# Patient Record
Sex: Female | Born: 1990 | Hispanic: Yes | Marital: Single | Smoking: Never smoker
Health system: Southern US, Community
[De-identification: ages and names within clinical notes are randomized; demographics above are authoritative.]

## PROBLEM LIST (undated history)

## (undated) HISTORY — PX: TONSILLECTOMY: SUR1361

---

## 2020-06-06 ENCOUNTER — Emergency Department (HOSPITAL_COMMUNITY)
Admission: EM | Admit: 2020-06-06 | Discharge: 2020-06-06 | Disposition: A | Discharge status: Home / Self Care | Payer: Medicaid Other | Attending: Emergency Medicine | Admitting: Emergency Medicine

## 2020-06-06 ENCOUNTER — Encounter (HOSPITAL_COMMUNITY): Payer: Self-pay | Admitting: Emergency Medicine

## 2020-06-06 ENCOUNTER — Emergency Department (HOSPITAL_COMMUNITY): Payer: Medicaid Other

## 2020-06-06 DIAGNOSIS — D72829 Elevated white blood cell count, unspecified: Secondary | ICD-10-CM | POA: Diagnosis not present

## 2020-06-06 DIAGNOSIS — K529 Noninfective gastroenteritis and colitis, unspecified: Secondary | ICD-10-CM | POA: Diagnosis not present

## 2020-06-06 DIAGNOSIS — R109 Unspecified abdominal pain: Secondary | ICD-10-CM | POA: Diagnosis present

## 2020-06-06 LAB — CBC
HCT: 42 % (ref 36.0–46.0)
Hemoglobin: 14.1 g/dL (ref 12.0–15.0)
MCH: 29.2 pg (ref 26.0–34.0)
MCHC: 33.6 g/dL (ref 30.0–36.0)
MCV: 87 fL (ref 80.0–100.0)
Platelets: 258 10*3/uL (ref 150–400)
RBC: 4.83 MIL/uL (ref 3.87–5.11)
RDW: 12.1 % (ref 11.5–15.5)
WBC: 12.2 10*3/uL — ABNORMAL HIGH (ref 4.0–10.5)
nRBC: 0 % (ref 0.0–0.2)

## 2020-06-06 LAB — COMPREHENSIVE METABOLIC PANEL
ALT: 27 U/L (ref 0–44)
AST: 24 U/L (ref 15–41)
Albumin: 4.7 g/dL (ref 3.5–5.0)
Alkaline Phosphatase: 85 U/L (ref 38–126)
Anion gap: 6 (ref 5–15)
BUN: 11 mg/dL (ref 6–20)
CO2: 27 mmol/L (ref 22–32)
Calcium: 9.7 mg/dL (ref 8.9–10.3)
Chloride: 102 mmol/L (ref 98–111)
Creatinine, Ser: 0.68 mg/dL (ref 0.44–1.00)
GFR, Estimated: >60 mL/min (ref >60)
Glucose, Bld: 100 mg/dL — ABNORMAL HIGH (ref 70–99)
Potassium: 4.6 mmol/L (ref 3.5–5.1)
Sodium: 135 mmol/L (ref 135–145)
Total Bilirubin: 1.4 mg/dL — ABNORMAL HIGH (ref 0.3–1.2)
Total Protein: 8 g/dL (ref 6.5–8.1)

## 2020-06-06 LAB — I-STAT BETA HCG BLOOD, ED (MC, WL, AP ONLY): I-stat hCG, quantitative: <5 m[IU]/mL (ref <5)

## 2020-06-06 LAB — LIPASE, BLOOD: Lipase: 36 U/L (ref 11–51)

## 2020-06-06 MED ORDER — ONDANSETRON 4 MG PO TBDP: 4.0000 mg | ORAL_TABLET | Freq: Three times a day (TID) | ORAL | 0 refills | Status: DC | PRN

## 2020-06-06 MED ORDER — DICYCLOMINE HCL 20 MG PO TABS: 20.0000 mg | ORAL_TABLET | Freq: Two times a day (BID) | ORAL | 0 refills | Status: AC

## 2020-06-06 MED ORDER — ALUM & MAG HYDROXIDE-SIMETH 200-200-20 MG/5ML PO SUSP
30.0000 mL | Freq: Once | ORAL | Status: AC
  Administered 2020-06-06: 30 mL via ORAL
  Filled 2020-06-06: qty 30

## 2020-06-06 MED ORDER — DICYCLOMINE HCL 20 MG PO TABS: 20.0000 mg | ORAL_TABLET | Freq: Two times a day (BID) | ORAL | 0 refills | Status: AC | PRN

## 2020-06-06 MED ORDER — ONDANSETRON HCL 8 MG PO TABS: 8.0000 mg | ORAL_TABLET | Freq: Three times a day (TID) | ORAL | 0 refills | Status: DC | PRN

## 2020-06-06 MED ORDER — LIDOCAINE VISCOUS HCL 2 % MT SOLN
15.0000 mL | Freq: Once | OROMUCOSAL | Status: AC
  Administered 2020-06-06: 15 mL via ORAL
  Filled 2020-06-06: qty 15

## 2020-06-06 NOTE — ED Provider Notes (Signed)
[facility] EMERGENCY DEPARTMENT Provider Note   CSN: [id number] Arrival date & time: 06/06/20  1125     History Chief Complaint  Patient presents with  . Abdominal Pain    Marcia Owen is a 30 y.o. female.  HPI      Marcia Owen is a 30 y.o. female, patient with no pertinent past medical history, presenting to the ED with abdominal pain beginning around 4 AM this morning.   She did have 2 episodes of nonbloody, nonbilious emesis. Initially, her pain was more severe, has improved since onset.  Pain is cramping, left upper quadrant and epigastric, nonradiating from these locations, intermittent. She is 9 months postpartum and is breast-feeding.  Denies fever/chills, diarrhea, constipation, hematochezia/melena, urinary symptoms, chest pain, shortness of breath, cough, flank/back pain, or any other complaints.   History reviewed. No pertinent past medical history.  There are no problems to display for this patient.   Past Surgical History:  Procedure Laterality Date  . TONSILLECTOMY       OB History   No obstetric history on file.     History reviewed. No pertinent family history.  Social History   Tobacco Use  . Smoking status: Never Smoker  . Smokeless tobacco: Never Used  Substance Use Topics  . Alcohol use: Not Currently  . Drug use: Not Currently    Home Medications Prior to Admission medications   Medication Sig Start Date End Date Taking? Authorizing Provider  dicyclomine (BENTYL) 20 MG tablet Take 1 tablet (20 mg total) by mouth 2 (two) times daily. 06/06/20  Yes Liam Bossman C, PA-C  dicyclomine (BENTYL) 20 MG tablet Take 1 tablet (20 mg total) by mouth every 12 (twelve) hours as needed (Abdominal cramping). 06/06/20  Yes Mancel Bale, MD  ondansetron (ZOFRAN ODT) 4 MG disintegrating tablet Take 1 tablet (4 mg total) by mouth every 8 (eight) hours as needed for nausea or vomiting. 06/06/20  Yes Mikhayla Phillis C, PA-C  ondansetron  (ZOFRAN) 8 MG tablet Take 1 tablet (8 mg total) by mouth every 8 (eight) hours as needed for nausea or vomiting. 06/06/20  Yes Mancel Bale, MD    Allergies    Amoxicillin and Penicillins  Review of Systems   Review of Systems  Constitutional: Negative for chills, diaphoresis and fever.  Respiratory: Negative for cough and shortness of breath.   Cardiovascular: Negative for chest pain.  Gastrointestinal: Positive for abdominal pain, nausea and vomiting. Negative for blood in stool and diarrhea.  Genitourinary: Negative for difficulty urinating, dysuria, flank pain, hematuria, vaginal bleeding and vaginal discharge.  Musculoskeletal: Negative for back pain.  Neurological: Negative for dizziness, syncope and weakness.  All other systems reviewed and are negative.   Physical Exam Updated Vital Signs BP 106/71   Pulse 90   Temp 98.3 F (36.8 C)   Resp 12   LMP 05/24/2020   SpO2 99%   Physical Exam Vitals and nursing note reviewed.  Constitutional:      General: She is not in acute distress.    Appearance: She is well-developed. She is not diaphoretic.  HENT:     Head: Normocephalic and atraumatic.     Mouth/Throat:     Mouth: Mucous membranes are moist.     Pharynx: Oropharynx is clear.  Eyes:     Conjunctiva/sclera: Conjunctivae normal.  Cardiovascular:     Rate and Rhythm: Normal rate and regular rhythm.     Pulses: Normal pulses.  Radial pulses are 2+ on the right side and 2+ on the left side.       Posterior tibial pulses are 2+ on the right side and 2+ on the left side.     Heart sounds: Normal heart sounds.     Comments: Tactile temperature in the extremities appropriate and equal bilaterally. Pulmonary:     Effort: Pulmonary effort is normal. No respiratory distress.     Breath sounds: Normal breath sounds.  Abdominal:     Palpations: Abdomen is soft.     Tenderness: There is abdominal tenderness in the epigastric area and left upper quadrant. There is  no guarding.     Comments: Seemingly mild tenderness in the areas indicated.  Patient indicates these areas verbally without other reaction.  Musculoskeletal:     Cervical back: Neck supple.     Right lower leg: No edema.     Left lower leg: No edema.  Skin:    General: Skin is warm and dry.  Neurological:     Mental Status: She is alert.  Psychiatric:        Mood and Affect: Mood and affect normal.        Speech: Speech normal.        Behavior: Behavior normal.     ED Results / Procedures / Treatments   Labs (all labs ordered are listed, but only abnormal results are displayed) Labs Reviewed  COMPREHENSIVE METABOLIC PANEL - Abnormal; Notable for the following components:      Result Value   Glucose, Bld 100 (*)    Total Bilirubin 1.4 (*)    All other components within normal limits  CBC - Abnormal; Notable for the following components:   WBC 12.2 (*)    All other components within normal limits  LIPASE, BLOOD  I-STAT BETA HCG BLOOD, ED ([facility], [facility], [facility] ONLY)    EKG None  Radiology CT ABDOMEN PELVIS WO CONTRAST  Result Date: 06/06/2020 CLINICAL DATA:  30 year old female with left upper quadrant abdominal pain. EXAM: CT ABDOMEN AND PELVIS WITHOUT CONTRAST TECHNIQUE: Multidetector CT imaging of the abdomen and pelvis was performed following the standard protocol without IV contrast. COMPARISON:  None. FINDINGS: Evaluation of this exam is limited in the absence of intravenous contrast. Lower chest: The visualized lung bases are clear. No intra-abdominal free air. Small free fluid in the pelvis. Hepatobiliary: The liver is unremarkable. No intrahepatic biliary dilatation. Small gallstone. No pericholecystic fluid or evidence of acute cholecystitis by CT. Pancreas: Unremarkable. No pancreatic ductal dilatation or surrounding inflammatory changes. Spleen: Normal in size without focal abnormality. Adrenals/Urinary Tract: The adrenal glands unremarkable. There is a punctate nonobstructing  left renal inferior pole calculus. No hydronephrosis. The right kidney is unremarkable. The visualized ureters and urinary bladder appear unremarkable. Stomach/Bowel: There is long segment thickening and inflammatory changes of small bowel in the mid abdomen which may represent enteritis. An inflammatory bowel disease is less likely as the TI and rectum are not involved. There is no bowel obstruction. The appendix is normal. Vascular/Lymphatic: The abdominal aorta and IVC are unremarkable. No portal venous gas. There is no adenopathy. Reproductive: The uterus is anteverted and grossly unremarkable. No adnexal masses. Other: None Musculoskeletal: No acute or significant osseous findings. IMPRESSION: 1. Enteritis. No bowel obstruction. Normal appendix. 2. Cholelithiasis. 3. Punctate nonobstructing left renal inferior pole calculus. No hydronephrosis. Electronically Signed   By: Elgie Collard M.D.   On: 06/06/2020 18:07    Procedures Procedures   Medications Ordered  in ED Medications  alum & mag hydroxide-simeth (MAALOX/MYLANTA) 200-200-20 MG/5ML suspension 30 mL (30 mLs Oral Given 06/06/20 1353)    And  lidocaine (XYLOCAINE) 2 % viscous mouth solution 15 mL (15 mLs Oral Given 06/06/20 1354)    ED Course  I have reviewed the triage vital signs and the nursing notes.  Pertinent labs & imaging results that were available during my care of the patient were reviewed by me and considered in my medical decision making (see chart for details).    MDM Rules/Calculators/A&P                          Patient presents with abdominal pain with a couple episodes of vomiting. Patient is nontoxic appearing, afebrile, not tachycardic, not tachypneic, not hypotensive, maintains excellent SPO2 on room air, and is in no apparent distress.   I have reviewed the patient's chart to obtain more information.   I reviewed and interpreted the patient's labs and radiological studies. Very mild leukocytosis. CT with  evidence of enteritis.  Based on consideration of the patient's vital signs, her presentation, and her subsequent benign abdominal exams, my suspicion the patient will need antibiotics for this issue is low.   Findings and plan of care discussed with attending physician, Mancel Bale, MD.    Vitals:   06/06/20 1512 06/06/20 1711 06/06/20 1845 06/06/20 1915  BP: 109/72 106/71  104/62  Pulse: 92 90 79 93  Resp: 16 12 13 17   Temp: 98.6 F (37 C) 98.3 F (36.8 C)  98.2 F (36.8 C)  TempSrc: Oral     SpO2: 98% 99% 98% 99%   This patient was evaluated during a time of global shortage of iodinated contrast media. Based on guidance from the of Radiology, best practices, and local institutional approaches an alternative path for evaluating and managing the patient may have been employed in order to provide optimal care during this shortage. The current situation has been discussed with the patient.   Final Clinical Impression(s) / ED Diagnoses Final diagnoses:  Enteritis    Rx / DC Orders ED Discharge Orders         Ordered    dicyclomine (BENTYL) 20 MG tablet  2 times daily        06/06/20 1906    ondansetron (ZOFRAN ODT) 4 MG disintegrating tablet  Every 8 hours PRN        06/06/20 1906    dicyclomine (BENTYL) 20 MG tablet  Every 12 hours PRN        06/06/20 2016    ondansetron (ZOFRAN) 8 MG tablet  Every 8 hours PRN        06/06/20 2016           2017, PA-C 06/08/20 1055    08/08/20, MD 06/10/20 1050

## 2020-06-06 NOTE — ED Provider Notes (Signed)
Emergency Medicine Provider Triage Evaluation Note  Marcia Owen , a 30 y.o. female  was evaluated in triage.  Pt complains of LUQ abdominal pain that she describes as "Twisting" that began this AM with 2 episodes of NBNB emesis. No nausea. Has had pain like this in the past but never been evaluated for same. No fevers or chills. No  Heavy NSAID use or EtOH use. No diarrhea. Last normal BM this AM. No previous abdominal surgeries.   Review of Systems  Positive: + abdominal pain, vomiting Negative: - diarrhea  Physical Exam  BP (!) 123/91 (BP Location: Right Arm)   Pulse 91   Temp 98.4 F (36.9 C) (Oral)   Resp 18   LMP 05/24/2020   SpO2 99%  Gen:   Awake, no distress   Resp:  Normal effort  MSK:   Moves extremities without difficulty  Other:  + LUQ abdominal TTP  Medical Decision Making  Medically screening exam initiated at 12:26 PM.  Appropriate orders placed.  Ena Demary was informed that the remainder of the evaluation will be completed by another provider, this initial triage assessment does not replace that evaluation, and the importance of remaining in the ED until their evaluation is complete.  Labs ordered. Pain seems to radiate to epigastrium. Question GERD vs PUD? GI cocktail ordered. Low suspicion for pancreatitis.    Tanda Rockers, PA-C 06/06/20 1228    Jacalyn Lefevre, MD 06/06/20 [phone]

## 2020-06-06 NOTE — Discharge Instructions (Signed)
Nausea and Vomiting  Hand washing: Wash your hands throughout the day, but especially before and after touching the face, using the restroom, sneezing, coughing, or touching surfaces that have been coughed or sneezed upon. Hydration: Symptoms will be intensified and complicated by dehydration. Dehydration can also extend the duration of symptoms. Drink plenty of fluids and get plenty of rest. You should be drinking at least half a liter of water an hour to stay hydrated. Electrolyte drinks (ex. Gatorade, Powerade, Pedialyte) are also encouraged. You should be drinking enough fluids to make your urine light yellow, almost clear. If this is not the case, you are not drinking enough water. Please note that some of the treatments indicated below will not be effective if you are not adequately hydrated. Diet: Please concentrate on hydration, however, you may introduce food slowly.  Start with a clear liquid diet, progressed to a full liquid diet, and then bland solids as you are able. Pain or fever: Ibuprofen, Naproxen, or Tylenol for pain or fever.  Nausea/vomiting: Use the ondansetron (generic for Zofran) for nausea or vomiting.  This medication may not prevent all vomiting or nausea, but can help facilitate better hydration. Things that can help with nausea/vomiting also include peppermint/menthol candies, vitamin B12, and ginger. Diarrhea: May use medications such as loperamide (Imodium) or Bismuth subsalicylate (Pepto-Bismol). Dicyclomine: Dicyclomine (generic for Bentyl) is what is known as an antispasmodic and is intended to help reduce abdominal discomfort. Follow-up: Follow-up with a primary care provider on this matter. Return: Return should you develop a fever, bloody diarrhea, increased abdominal pain, uncontrolled vomiting, or any other major concerns.  For prescription assistance, may try using prescription discount sites or apps, such as goodrx.com

## 2020-06-06 NOTE — ED Triage Notes (Addendum)
C/o intermittent LUQ pain since 4am with vomiting x 2.  Denies nausea, diarrhea, and urinary symptoms.  Denies pain at present.

## 2022-11-28 IMAGING — CT CT ABD-PELV W/O CM
2 of 4 series · 16 of 46 positions shown, 18 images · non-contrast
Comparison: None.

CLINICAL DATA: 29-year-old female with left upper quadrant
abdominal pain.

EXAM:
CT ABDOMEN AND PELVIS WITHOUT CONTRAST
TECHNIQUE: Multidetector CT imaging of the abdomen and pelvis was performed
following the standard protocol without IV contrast.

[Series 3: a/p w/o 5mm · axial · non-contrast · 0.93mm/px · z∈[+1005,+1430]mm · 13 of 93 slices shown, 15 images]
[im 4/93  soft-tissue]
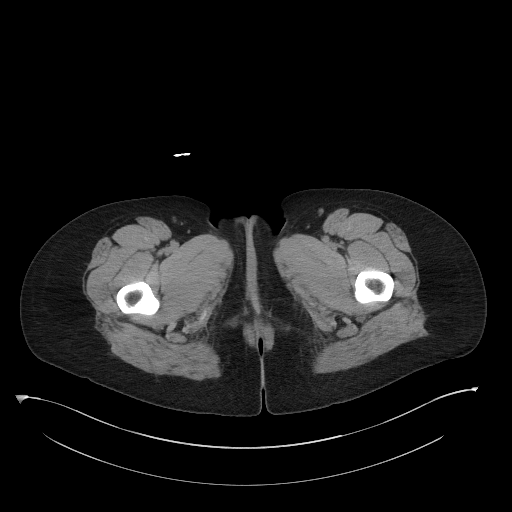
[im 4/93  bone]
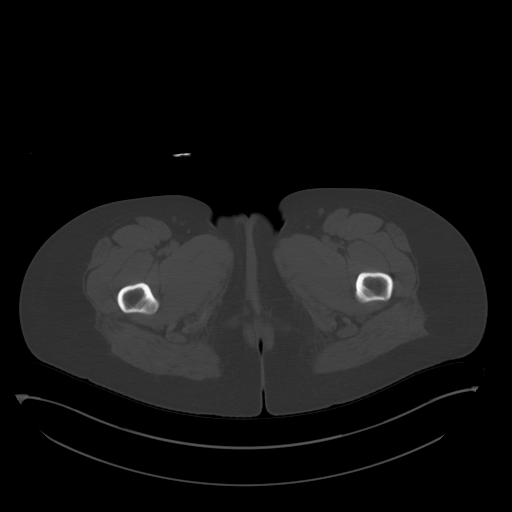
[im 11/93  soft-tissue]
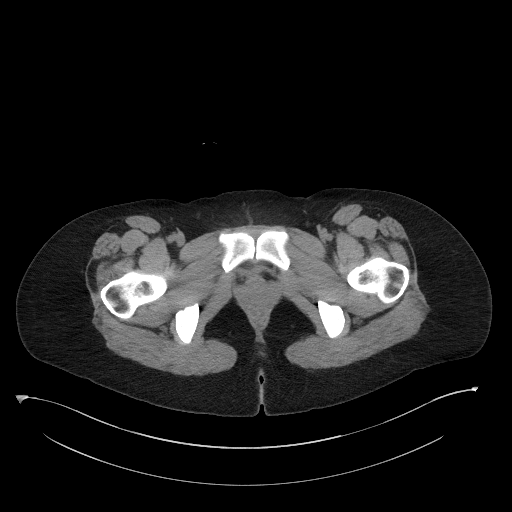
[im 18/93  soft-tissue]
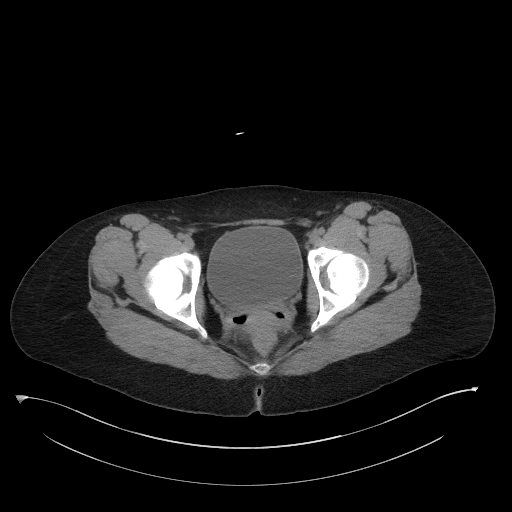
[im 25/93  soft-tissue]
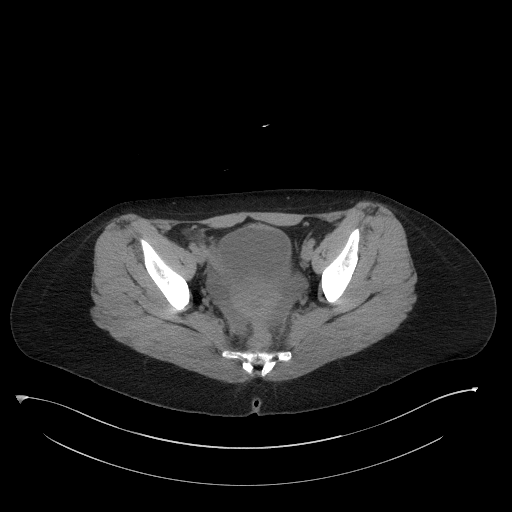
[im 32/93  soft-tissue]
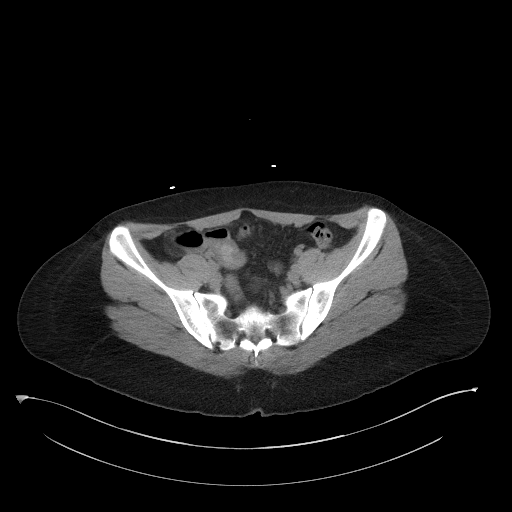
[im 39/93  soft-tissue]
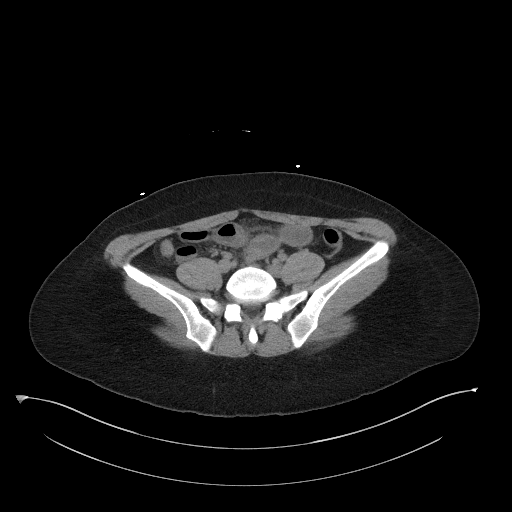
[im 47/93  soft-tissue]
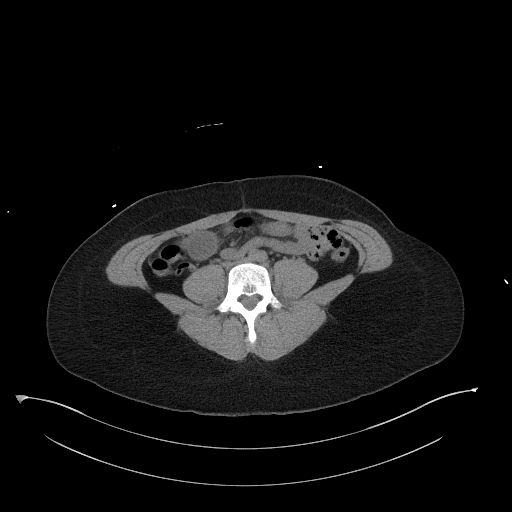
[im 54/93  soft-tissue]
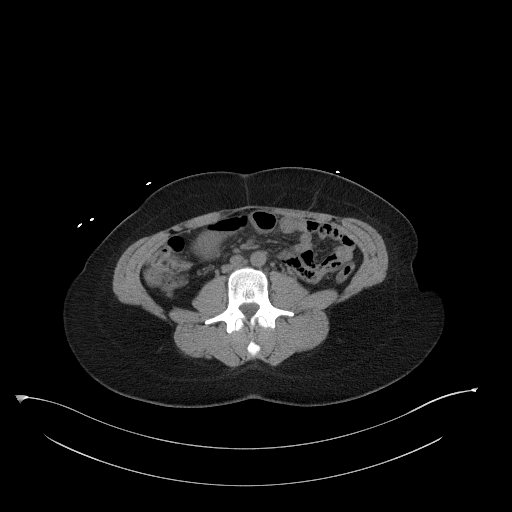
[im 61/93  soft-tissue]
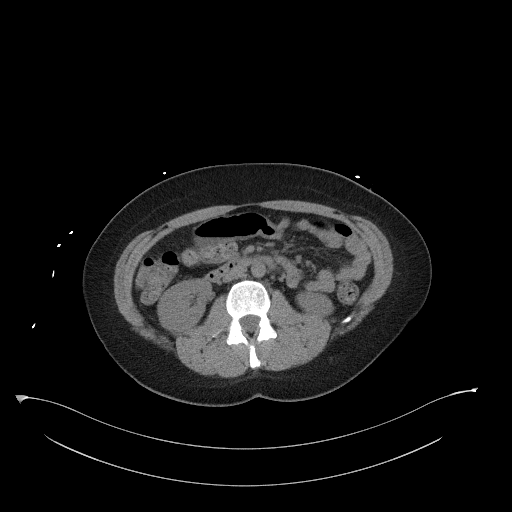
[im 61/93  bone]
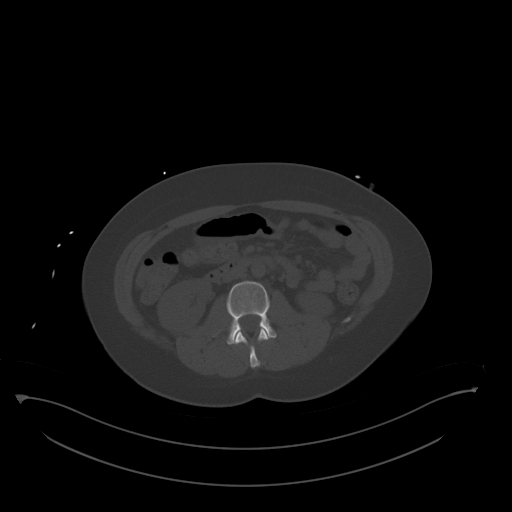
[im 68/93  soft-tissue]
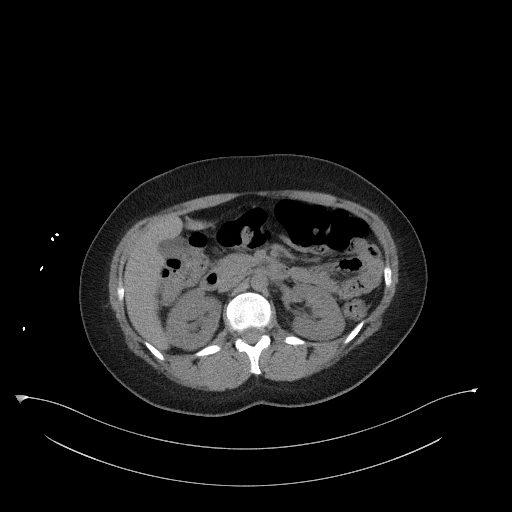
[im 75/93  soft-tissue]
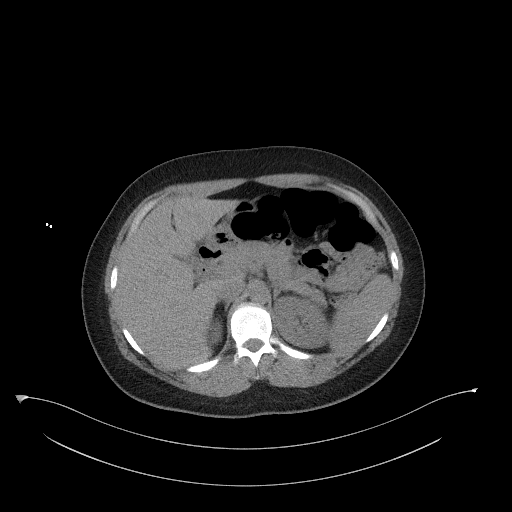
[im 82/93  soft-tissue]
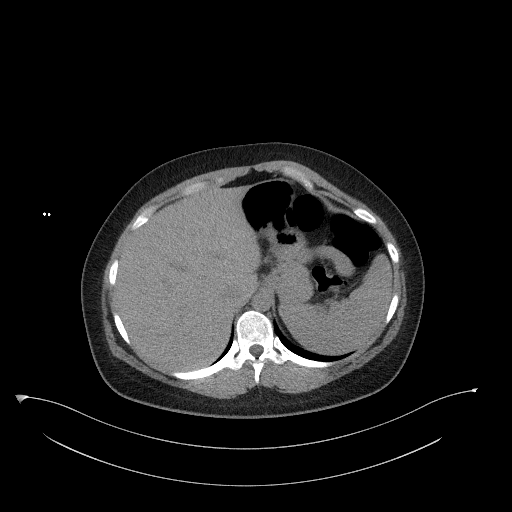
[im 89/93  soft-tissue]
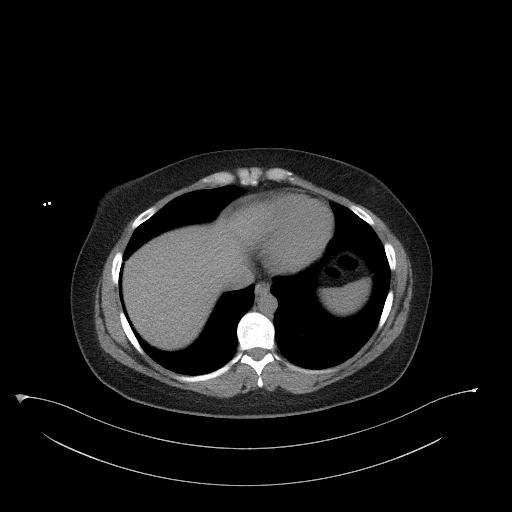

[Series 6: a/p w/o cor · coronal · non-contrast · 0.81mm/px · 3 of 140 slices shown]
[im 47/140  soft-tissue]
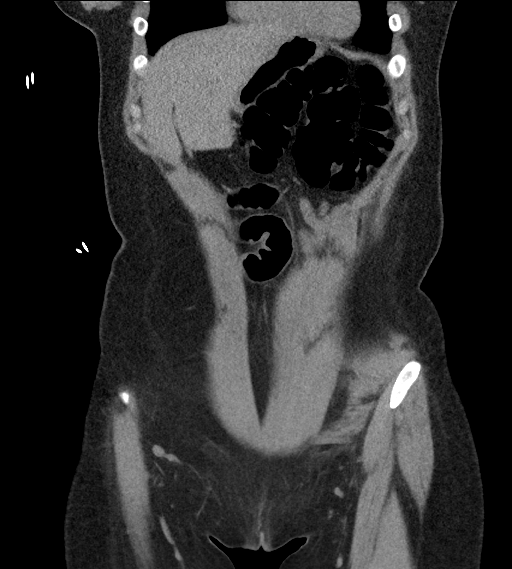
[im 62/140  soft-tissue]
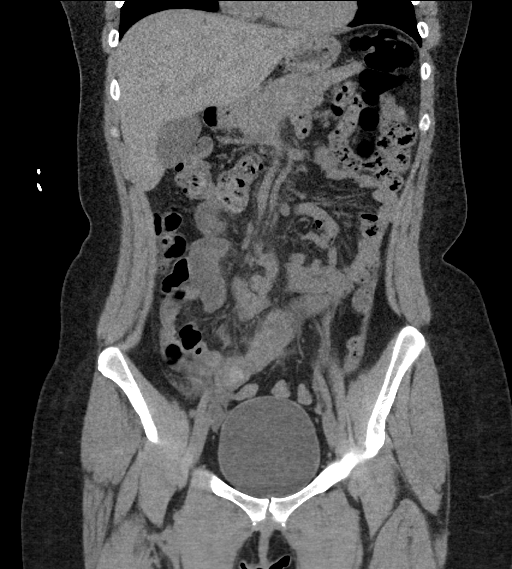
[im 78/140  soft-tissue]
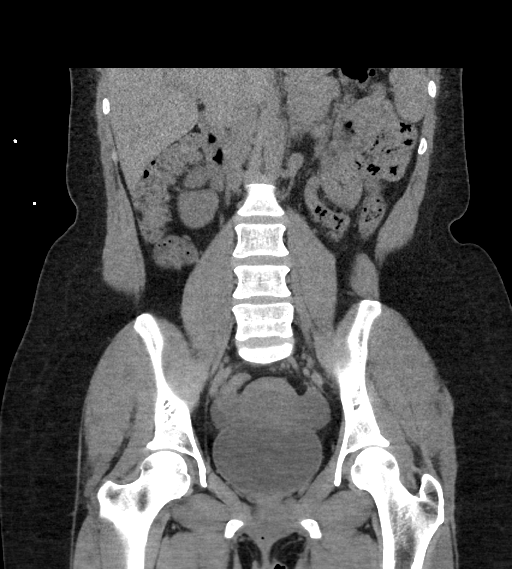

[16 of 46 positions shown; findings below may reference images not displayed]

FINDINGS: Evaluation of this exam is limited in the absence of intravenous
contrast.

Lower chest: The visualized lung bases are clear.

No intra-abdominal free air. Small free fluid in the pelvis.

Hepatobiliary: The liver is unremarkable. No intrahepatic biliary
dilatation. Small gallstone. No pericholecystic fluid or evidence of
acute cholecystitis by CT.

Pancreas: Unremarkable. No pancreatic ductal dilatation or
surrounding inflammatory changes.

Spleen: Normal in size without focal abnormality.

Adrenals/Urinary Tract: The adrenal glands unremarkable. There is a
punctate nonobstructing left renal inferior pole calculus. No
hydronephrosis. The right kidney is unremarkable. The visualized
ureters and urinary bladder appear unremarkable.

Stomach/Bowel: There is long segment thickening and inflammatory
changes of small bowel in the mid abdomen which may represent
enteritis. An inflammatory bowel disease is less likely as the TI
and rectum are not involved. There is no bowel obstruction. The
appendix is normal.

Vascular/Lymphatic: The abdominal aorta and IVC are unremarkable. No
portal venous gas. There is no adenopathy.

Reproductive: The uterus is anteverted and grossly unremarkable. No
adnexal masses.

Other: None

Musculoskeletal: No acute or significant osseous findings.
IMPRESSION: 1. Enteritis. No bowel obstruction. Normal appendix.
2. Cholelithiasis.
3. Punctate nonobstructing left renal inferior pole calculus. No
hydronephrosis.

## 2023-07-01 ENCOUNTER — Emergency Department (HOSPITAL_BASED_OUTPATIENT_CLINIC_OR_DEPARTMENT_OTHER)

## 2023-07-01 ENCOUNTER — Other Ambulatory Visit: Payer: Self-pay

## 2023-07-01 ENCOUNTER — Encounter (HOSPITAL_BASED_OUTPATIENT_CLINIC_OR_DEPARTMENT_OTHER): Payer: Self-pay | Admitting: Emergency Medicine

## 2023-07-01 ENCOUNTER — Observation Stay (HOSPITAL_BASED_OUTPATIENT_CLINIC_OR_DEPARTMENT_OTHER)
Admission: EM | Admit: 2023-07-01 | Discharge: 2023-07-03 | Discharge status: Against Medical Advice | Attending: Surgery | Admitting: Surgery

## 2023-07-01 DIAGNOSIS — K8020 Calculus of gallbladder without cholecystitis without obstruction: Principal | ICD-10-CM | POA: Insufficient documentation

## 2023-07-01 DIAGNOSIS — K8000 Calculus of gallbladder with acute cholecystitis without obstruction: Secondary | ICD-10-CM | POA: Diagnosis present

## 2023-07-01 DIAGNOSIS — Z79899 Other long term (current) drug therapy: Secondary | ICD-10-CM | POA: Insufficient documentation

## 2023-07-01 DIAGNOSIS — R1011 Right upper quadrant pain: Secondary | ICD-10-CM | POA: Diagnosis present

## 2023-07-01 LAB — URINALYSIS, ROUTINE W REFLEX MICROSCOPIC
Bilirubin Urine: NEGATIVE
Glucose, UA: NEGATIVE mg/dL
Hgb urine dipstick: NEGATIVE
Ketones, ur: 15 mg/dL — AB
Nitrite: NEGATIVE
Protein, ur: NEGATIVE mg/dL
Specific Gravity, Urine: 1.02 (ref 1.005–1.030)
pH: 7 (ref 5.0–8.0)

## 2023-07-01 LAB — CBC
HCT: 36.7 % (ref 36.0–46.0)
Hemoglobin: 11.9 g/dL — ABNORMAL LOW (ref 12.0–15.0)
MCH: 27.4 pg (ref 26.0–34.0)
MCHC: 32.4 g/dL (ref 30.0–36.0)
MCV: 84.6 fL (ref 80.0–100.0)
Platelets: 263 10*3/uL (ref 150–400)
RBC: 4.34 MIL/uL (ref 3.87–5.11)
RDW: 12.8 % (ref 11.5–15.5)
WBC: 9.8 10*3/uL (ref 4.0–10.5)
nRBC: 0 % (ref 0.0–0.2)

## 2023-07-01 LAB — URINALYSIS, MICROSCOPIC (REFLEX): RBC / HPF: NONE SEEN RBC/hpf (ref 0–5)

## 2023-07-01 LAB — COMPREHENSIVE METABOLIC PANEL WITH GFR
ALT: 52 U/L — ABNORMAL HIGH (ref 0–44)
AST: 45 U/L — ABNORMAL HIGH (ref 15–41)
Albumin: 4.3 g/dL (ref 3.5–5.0)
Alkaline Phosphatase: 135 U/L — ABNORMAL HIGH (ref 38–126)
Anion gap: 11 (ref 5–15)
BUN: <5 mg/dL — ABNORMAL LOW (ref 6–20)
CO2: 26 mmol/L (ref 22–32)
Calcium: 9.8 mg/dL (ref 8.9–10.3)
Chloride: 102 mmol/L (ref 98–111)
Creatinine, Ser: 0.65 mg/dL (ref 0.44–1.00)
GFR, Estimated: >60 mL/min (ref >60)
Glucose, Bld: 93 mg/dL (ref 70–99)
Potassium: 4.1 mmol/L (ref 3.5–5.1)
Sodium: 139 mmol/L (ref 135–145)
Total Bilirubin: 0.9 mg/dL (ref 0.0–1.2)
Total Protein: 7.7 g/dL (ref 6.5–8.1)

## 2023-07-01 LAB — TROPONIN T, HIGH SENSITIVITY: Troponin T High Sensitivity: <15 ng/L (ref <19)

## 2023-07-01 LAB — LIPASE, BLOOD: Lipase: 36 U/L (ref 11–51)

## 2023-07-01 LAB — PREGNANCY, URINE: Preg Test, Ur: NEGATIVE

## 2023-07-01 MED ORDER — ALUM & MAG HYDROXIDE-SIMETH 200-200-20 MG/5ML PO SUSP
30.0000 mL | Freq: Once | ORAL | Status: AC
  Administered 2023-07-01: 30 mL via ORAL
  Filled 2023-07-01: qty 30

## 2023-07-01 MED ORDER — MORPHINE SULFATE (PF) 4 MG/ML IV SOLN
4.0000 mg | Freq: Once | INTRAVENOUS | Status: AC
  Administered 2023-07-01: 4 mg via INTRAVENOUS
  Filled 2023-07-01: qty 1

## 2023-07-01 MED ORDER — CIPROFLOXACIN IN D5W 400 MG/200ML IV SOLN
400.0000 mg | Freq: Once | INTRAVENOUS | Status: AC
  Administered 2023-07-01: 400 mg via INTRAVENOUS
  Filled 2023-07-01: qty 200

## 2023-07-01 MED ORDER — IOHEXOL 300 MG/ML  SOLN
100.0000 mL | Freq: Once | INTRAMUSCULAR | Status: AC | PRN
  Administered 2023-07-01: 100 mL via INTRAVENOUS

## 2023-07-01 MED ORDER — ONDANSETRON HCL 4 MG/2ML IJ SOLN
4.0000 mg | Freq: Once | INTRAMUSCULAR | Status: AC
  Administered 2023-07-01: 4 mg via INTRAVENOUS
  Filled 2023-07-01: qty 2

## 2023-07-01 MED ORDER — PROCHLORPERAZINE EDISYLATE 10 MG/2ML IJ SOLN
10.0000 mg | Freq: Once | INTRAMUSCULAR | Status: AC
  Administered 2023-07-01: 10 mg via INTRAVENOUS
  Filled 2023-07-01: qty 2

## 2023-07-01 MED ORDER — DIPHENHYDRAMINE HCL 50 MG/ML IJ SOLN
12.5000 mg | Freq: Once | INTRAMUSCULAR | Status: AC
  Administered 2023-07-01: 12.5 mg via INTRAVENOUS
  Filled 2023-07-01: qty 1

## 2023-07-01 MED ORDER — SODIUM CHLORIDE 0.9 % IV BOLUS
1000.0000 mL | Freq: Once | INTRAVENOUS | Status: AC
  Administered 2023-07-01: 1000 mL via INTRAVENOUS

## 2023-07-01 MED ORDER — MORPHINE SULFATE (PF) 4 MG/ML IV SOLN: 4.0000 mg | Freq: Once | INTRAVENOUS | Status: DC

## 2023-07-01 MED ORDER — LACTATED RINGERS IV BOLUS
1000.0000 mL | Freq: Once | INTRAVENOUS | Status: AC
  Administered 2023-07-01: 1000 mL via INTRAVENOUS

## 2023-07-01 NOTE — ED Triage Notes (Signed)
 Gl abd pain x 2 days , burning sensation , pain going to back , nausea and emesis x 2 days.  Denies urinary symptoms , Hx GERD yet never this bad she said

## 2023-07-01 NOTE — ED Provider Notes (Signed)
 [place] EMERGENCY DEPARTMENT AT [facility] Provider Note   CSN: [id number] Arrival date & time: 07/01/23  1541     Patient presents with: Abdominal Pain   Marcia Owen is a 33 y.o. female self reportedly otherwise healthy presents emerged from today for evaluation of upper abdominal pain.  Patient reports that this started 2 days prior.  The first day it was more epigastric and to the left.  She went to an ER in [place]  and diagnosed with gastritis after doing labs.  She has tried 1 dose of Pepcid and reports no significant change in her symptoms so came into the emergency ferment today.  She reports that now it is more epigastric and to the right.  She had 2 episodes of emesis yesterday that was the food that she consumed.  1 episode of emesis today.  Not black or bloody.  She has been constipated since this and is unsure of her stool color.  Denies any dysuria or hematuria.  Still able to pass gas and has been belching as well.  She denies any fever, cough, recent flulike symptoms.  She has been drinking tea but has not tried any other medications.  She is on the birth control patch.  Allergic to penicillin amoxicillin.  Did have alcoholic beverages a Saturday before symptoms started but only reports 1 mixed drink.  Denies any tobacco or drug use.   Abdominal Pain Associated symptoms: constipation, nausea and vomiting   Associated symptoms: no chest pain, no chills, no dysuria, no fever, no hematuria and no shortness of breath        Prior to Admission medications   Medication Sig Start Date End Date Taking? Authorizing Provider  dicyclomine  (BENTYL ) 20 MG tablet Take 1 tablet (20 mg total) by mouth 2 (two) times daily. 06/06/20   Joy, Shawn C, PA-C  dicyclomine  (BENTYL ) 20 MG tablet Take 1 tablet (20 mg total) by mouth every 12 (twelve) hours as needed (Abdominal cramping). 06/06/20   Lorriane Holmes, MD  ondansetron  (ZOFRAN  ODT) 4 MG disintegrating tablet Take 1 tablet (4  mg total) by mouth every 8 (eight) hours as needed for nausea or vomiting. 06/06/20   Joy, Shawn C, PA-C  ondansetron  (ZOFRAN ) 8 MG tablet Take 1 tablet (8 mg total) by mouth every 8 (eight) hours as needed for nausea or vomiting. 06/06/20   Lorriane Holmes, MD    Allergies: Amoxicillin and Penicillins    Review of Systems  Constitutional:  Negative for chills and fever.  Respiratory:  Negative for shortness of breath.   Cardiovascular:  Negative for chest pain.  Gastrointestinal:  Positive for abdominal pain, constipation, nausea and vomiting.  Genitourinary:  Negative for dysuria and hematuria.    Updated Vital Signs BP (!) 143/67 (BP Location: Left Arm)   Pulse 62   Temp 98.1 F (36.7 C)   Resp 20   Wt 82.1 kg   LMP 06/15/2023 (Exact Date)   SpO2 100%   Physical Exam Vitals and nursing note reviewed.  Constitutional:      Appearance: She is not toxic-appearing.     Comments: Uncomfortable, nontoxic-appearing   Eyes:     General: No scleral icterus.   Cardiovascular:     Rate and Rhythm: Normal rate.  Pulmonary:     Effort: Pulmonary effort is normal. No respiratory distress.  Abdominal:     Palpations: Abdomen is soft.     Tenderness: There is abdominal tenderness.     Comments: Patient  has some diffuse upper abdominal tenderness but mainly to the right upper quadrant.  Does have some lower belly tenderness but feels it in the right upper quadrant still.  Soft.  No guarding or rebound.   Skin:    General: Skin is warm and dry.   Neurological:     Mental Status: She is alert.     (all labs ordered are listed, but only abnormal results are displayed) Labs Reviewed  COMPREHENSIVE METABOLIC PANEL WITH GFR - Abnormal; Notable for the following components:      Result Value   BUN <5 (*)    AST 45 (*)    ALT 52 (*)    Alkaline Phosphatase 135 (*)    All other components within normal limits  CBC - Abnormal; Notable for the following components:   Hemoglobin 11.9  (*)    All other components within normal limits  LIPASE, BLOOD  URINALYSIS, ROUTINE W REFLEX MICROSCOPIC  PREGNANCY, URINE  TROPONIN T, HIGH SENSITIVITY    EKG: None  Radiology: No results found.  Procedures   Medications Ordered in the ED  lactated ringers bolus 1,000 mL (has no administration in time range)  morphine (PF) 4 MG/ML injection 4 mg (4 mg Intravenous Given 07/01/23 1855)  ondansetron  (ZOFRAN ) injection 4 mg (4 mg Intravenous Given 07/01/23 1854)  alum & mag hydroxide-simeth (MAALOX/MYLANTA) 200-200-20 MG/5ML suspension 30 mL (30 mLs Oral Given 07/01/23 1853)    Medical Decision Making Amount and/or Complexity of Data Reviewed Labs: ordered. Radiology: ordered.  Risk OTC drugs. Prescription drug management.   33 y.o. female presents to the ER for evaluation of abdominal pain. Differential diagnosis includes but is not limited to AAA, mesenteric ischemia, appendicitis, diverticulitis, DKA, gastroenteritis, nephrolithiasis, pancreatitis, constipation, UTI, bowel obstruction, biliary disease, IBD, PUD, hepatitis. Vital signs mildly elevated blood pressure otherwise unremarkable. Physical exam as noted above.   Patient has tenderness to abdomen mainly to the right upper quadrant.  She describes it as a burning sensation does radiate some of her back.  She has tried 1 dose of Pepcid not felt relief with her symptoms.  Did go back to [place] .  Reports that her symptoms started after having some macaroni and cheese and pizza.  Will treat her with some fluids, pain medication, nausea medication, and GI cocktail.  Will also order upper quadrant ultrasound given that she is more tender to this area, if normal will with CT scan.  I independently reviewed and interpreted the patient's labs.  CBC shows slight decrease in hemoglobin 11.9, BUN less than 5 with AST of 45, ALT at 52, alk phos at 135 but a normal total bili.  No other electrolyte or LFT abnormality.  Lipase within  normal limits.  Urinalysis only to be collected.  Pregnancy test still needs collected.  Troponin in process.  Right upper quadrant ultrasound ordered.  Will handoff to oncoming shift.  If ultrasound is normal can order CT scan.  Recommend reevaluation for symptoms.  6:57 PM Care of Marcia Owen transferred to [facility] at the end of my shift as the patient will require reassessment once labs/imaging have resulted. Patient presentation, ED course, and plan of care discussed with review of all pertinent labs and imaging. Please see his/her note for further details regarding further ED course and disposition. Plan at time of handoff is follow up with labs and imaging. This may be altered or completely changed at the discretion of the oncoming team pending results of further workup.  Portions of this report may have been transcribed using voice recognition software. Every effort was made to ensure accuracy; however, inadvertent computerized transcription errors may be present.   Final diagnoses:  None    ED Discharge Orders     None          Bernis Ernst, PA-C 07/01/23 1903    Ruthe Cornet, DO 07/01/23 1921

## 2023-07-01 NOTE — ED Provider Notes (Signed)
  Physical Exam  BP (!) 140/81 (BP Location: Left Arm)   Pulse 62   Temp 98.1 F (36.7 C)   Resp 18   Wt 82.1 kg   LMP 06/15/2023 (Exact Date)   SpO2 100%   Physical Exam Vitals and nursing note reviewed.  Constitutional:      General: She is not in acute distress.    Appearance: She is not toxic-appearing.  HENT:     Head: Normocephalic and atraumatic.   Eyes:     General: No scleral icterus.    Conjunctiva/sclera: Conjunctivae normal.    Cardiovascular:     Rate and Rhythm: Normal rate and regular rhythm.     Pulses: Normal pulses.     Heart sounds: Normal heart sounds.  Pulmonary:     Effort: Pulmonary effort is normal. No respiratory distress.     Breath sounds: Normal breath sounds.  Abdominal:     General: Abdomen is flat. Bowel sounds are normal.     Palpations: Abdomen is soft.     Tenderness: There is abdominal tenderness in the right upper quadrant and epigastric area.   Skin:    General: Skin is warm and dry.     Findings: No lesion.   Neurological:     General: No focal deficit present.     Mental Status: She is alert and oriented to person, place, and time. Mental status is at baseline.     Procedures  Procedures  ED Course / MDM   Clinical Course as of 07/01/23 2320  Tue Jul 01, 2023  2120 [place] continues to express pain to right upper quadrant epigastric area. Does not want more pain medication, wants to try nausea medication first.  [JB]    Clinical Course User Index [JB] Alyssah Algeo, Warren SAILOR, PA-C   Medical Decision Making Amount and/or Complexity of Data Reviewed Labs: ordered. Radiology: ordered.  Risk OTC drugs. Prescription drug management. Decision regarding hospitalization.   Patient was received at shift handoff.  In short patient presents with epigastric pain for 2 days.  Originally rated to the left and has now started radiating to the right.  She has had nausea and vomiting.  She notes her pain is very severe.     Patient is hemodynamically stable.  She does not have white count.  She does have bump in AST, ALT and alk phos.  Normal total bilirubin.  Right upper quadrant ultrasound shows cholelithiasis.  There is no findings of acute cholecystitis.  Patient continues to have pain that is quite severe on exam.  Will further assess with CT abdomen pelvis.  CT abdomen pelvis shows gallstones with gallbladder wall thickening.  Again continues to have pain not tolerating oral intake.  Will call general surgery for recommendations.  Spoke to general surgery Dr. Aron.  Discussed patient's labs and imaging.  Recommends operative management tomorrow at [facility].  Patient expresses understanding and agrees to plan.  Antibiotics ordered.       Shermon Warren SAILOR, PA-C 07/01/23 2322    Ruthe Cornet, DO 07/02/23 1457

## 2023-07-02 DIAGNOSIS — K8000 Calculus of gallbladder with acute cholecystitis without obstruction: Secondary | ICD-10-CM | POA: Diagnosis present

## 2023-07-02 DIAGNOSIS — R1084 Generalized abdominal pain: Secondary | ICD-10-CM | POA: Diagnosis not present

## 2023-07-02 DIAGNOSIS — Z743 Need for continuous supervision: Secondary | ICD-10-CM | POA: Diagnosis not present

## 2023-07-02 DIAGNOSIS — R112 Nausea with vomiting, unspecified: Secondary | ICD-10-CM | POA: Diagnosis not present

## 2023-07-02 DIAGNOSIS — K8020 Calculus of gallbladder without cholecystitis without obstruction: Secondary | ICD-10-CM | POA: Diagnosis present

## 2023-07-02 LAB — HIV ANTIBODY (ROUTINE TESTING W REFLEX): HIV Screen 4th Generation wRfx: NONREACTIVE

## 2023-07-02 MED ORDER — DOCUSATE SODIUM 100 MG PO CAPS: 100.0000 mg | ORAL_CAPSULE | Freq: Two times a day (BID) | ORAL | Status: DC

## 2023-07-02 MED ORDER — CIPROFLOXACIN IN D5W 400 MG/200ML IV SOLN
400.0000 mg | Freq: Two times a day (BID) | INTRAVENOUS | Status: DC
  Administered 2023-07-02 – 2023-07-03 (×2): 400 mg via INTRAVENOUS
  Filled 2023-07-02 (×2): qty 200

## 2023-07-02 MED ORDER — ONDANSETRON 4 MG PO TBDP
4.0000 mg | ORAL_TABLET | Freq: Four times a day (QID) | ORAL | Status: DC | PRN
  Administered 2023-07-02: 4 mg via ORAL
  Filled 2023-07-02: qty 1

## 2023-07-02 MED ORDER — IBUPROFEN 400 MG PO TABS: 600.0000 mg | ORAL_TABLET | ORAL | Status: AC

## 2023-07-02 MED ORDER — ONDANSETRON HCL 4 MG/2ML IJ SOLN: 4.0000 mg | Freq: Four times a day (QID) | INTRAMUSCULAR | Status: DC | PRN

## 2023-07-02 MED ORDER — DIPHENHYDRAMINE HCL 25 MG PO CAPS: 25.0000 mg | ORAL_CAPSULE | Freq: Four times a day (QID) | ORAL | Status: DC | PRN

## 2023-07-02 MED ORDER — LACTATED RINGERS IV SOLN: INTRAVENOUS | Status: AC

## 2023-07-02 MED ORDER — ACETAMINOPHEN 500 MG PO TABS
1000.0000 mg | ORAL_TABLET | ORAL | Status: AC
  Filled 2023-07-02: qty 2

## 2023-07-02 MED ORDER — ACETAMINOPHEN 500 MG PO TABS
1000.0000 mg | ORAL_TABLET | Freq: Four times a day (QID) | ORAL | Status: DC | PRN
  Administered 2023-07-02 (×2): 1000 mg via ORAL
  Filled 2023-07-02: qty 2

## 2023-07-02 MED ORDER — DIPHENHYDRAMINE HCL 50 MG/ML IJ SOLN: 25.0000 mg | Freq: Four times a day (QID) | INTRAMUSCULAR | Status: DC | PRN

## 2023-07-02 MED ORDER — METOPROLOL TARTRATE 5 MG/5ML IV SOLN: 5.0000 mg | Freq: Four times a day (QID) | INTRAVENOUS | Status: DC | PRN

## 2023-07-02 MED ORDER — PROCHLORPERAZINE EDISYLATE 10 MG/2ML IJ SOLN: 5.0000 mg | Freq: Four times a day (QID) | INTRAMUSCULAR | Status: DC | PRN

## 2023-07-02 MED ORDER — OXYCODONE HCL 5 MG PO TABS
5.0000 mg | ORAL_TABLET | ORAL | Status: DC | PRN
  Administered 2023-07-02 – 2023-07-03 (×2): 5 mg via ORAL
  Filled 2023-07-02 (×2): qty 1

## 2023-07-02 MED ORDER — ENOXAPARIN SODIUM 40 MG/0.4ML IJ SOSY: 40.0000 mg | PREFILLED_SYRINGE | INTRAMUSCULAR | Status: DC

## 2023-07-02 MED ORDER — HYDROMORPHONE HCL 1 MG/ML IJ SOLN: 0.5000 mg | INTRAMUSCULAR | Status: DC | PRN

## 2023-07-02 MED ORDER — PROCHLORPERAZINE MALEATE 10 MG PO TABS: 10.0000 mg | ORAL_TABLET | Freq: Four times a day (QID) | ORAL | Status: DC | PRN

## 2023-07-02 NOTE — ED Notes (Signed)
 Pt is having pain in the upper left/right of her abdomen... Rates the pain 5/10... Pt is having Nausea.... Pt given oxy and zofran  from the PRN order.SABRASABRA

## 2023-07-02 NOTE — H&P (Signed)
 Marcia Owen is an 33 y.o. female.   Chief Complaint: Abdominal pain HPI: 33 year old female with a 1 day history of epigastric upper quadrant abdominal pain.  She is visiting from [place] .  Pain was severe therefore she sought care visit at [facility].  Workup revealed ultrasound gallstones without signs of gallbladder wall thickening.  She continued to have epigastric and right upper quadrant pain and was sent to [facility].  Currently, her pain is a 5-6 out of 10 location right lower quadrant with radiation to her back.  No nausea or vomiting.  Pain medicine does help.  She has normal white count and minimally elevated transaminases.  Bilirubin is normal.  Common bile duct 2 mm.  Gallstones ultrasound gallbladder wall thickening.  History reviewed. No pertinent past medical history.  Past Surgical History:  Procedure Laterality Date   TONSILLECTOMY      History reviewed. No pertinent family history. Social History:  reports that she has never smoked. She has never used smokeless tobacco. She reports that she does not currently use alcohol. She reports that she does not currently use drugs.  Allergies:  Allergies  Allergen Reactions   Amoxicillin Anaphylaxis and Hives   Penicillins Anaphylaxis and Hives    Medications Prior to Admission  Medication Sig Dispense Refill   famotidine (PEPCID) 20 MG tablet Take 20 mg by mouth 2 (two) times daily.     ondansetron  (ZOFRAN -ODT) 4 MG disintegrating tablet Take 4 mg by mouth every 8 (eight) hours as needed. for nausea     XULANE 150-35 MCG/24HR transdermal patch Place 1 patch onto the skin once a week.     dicyclomine  (BENTYL ) 20 MG tablet Take 1 tablet (20 mg total) by mouth 2 (two) times daily. (Patient not taking: Reported on 07/02/2023) 20 tablet 0   dicyclomine  (BENTYL ) 20 MG tablet Take 1 tablet (20 mg total) by mouth every 12 (twelve) hours as needed (Abdominal cramping). (Patient not taking: Reported on 07/02/2023) 20 tablet 0     Results for orders placed or performed during the hospital encounter of 07/01/23 (from the past 48 hours)  Urinalysis, Routine w reflex microscopic -Urine, Clean Catch     Status: Abnormal   Collection Time: 07/01/23  3:52 PM  Result Value Ref Range   Color, Urine YELLOW YELLOW   APPearance CLEAR CLEAR   Specific Gravity, Urine 1.020 1.005 - 1.030   pH 7.0 5.0 - 8.0   Glucose, UA NEGATIVE NEGATIVE mg/dL   Hgb urine dipstick NEGATIVE NEGATIVE   Bilirubin Urine NEGATIVE NEGATIVE   Ketones, ur 15 (A) NEGATIVE mg/dL   Protein, ur NEGATIVE NEGATIVE mg/dL   Nitrite NEGATIVE NEGATIVE   Leukocytes,Ua TRACE (A) NEGATIVE    Comment: Performed at [facility], [address] [place], [place] [address]  Urinalysis, Microscopic (reflex)     Status: Abnormal   Collection Time: 07/01/23  3:52 PM  Result Value Ref Range   RBC / HPF NONE SEEN 0 - 5 RBC/hpf   WBC, UA 0-5 0 - 5 WBC/hpf   Bacteria, UA RARE (A) NONE SEEN   Squamous Epithelial / HPF 0-5 0 - 5 /HPF    Comment: Performed at [facility], [address] [place], [place] [address]  Lipase, blood     Status: None   Collection Time: 07/01/23  4:50 PM  Result Value Ref Range   Lipase 36 11 - 51 U/L    Comment: Performed at [facility]  [address], [address] [place], [place] [address]  Comprehensive metabolic panel     Status: Abnormal   Collection Time: 07/01/23  4:50 PM  Result Value Ref Range   Sodium 139 135 - 145 mmol/L   Potassium 4.1 3.5 - 5.1 mmol/L   Chloride 102 98 - 111 mmol/L   CO2 26 22 - 32 mmol/L   Glucose, Bld 93 70 - 99 mg/dL    Comment: Glucose reference range applies only to samples taken after fasting for at least 8 hours.   BUN <5 (L) 6 - 20 mg/dL   Creatinine, Ser 9.34 0.44 - 1.00 mg/dL   Calcium 9.8 8.9 - 89.6 mg/dL   Total Protein 7.7 6.5 - 8.1 g/dL   Albumin 4.3 3.5 - 5.0 g/dL   AST 45 (H) 15 - 41 U/L   ALT 52 (H) 0 - 44 U/L   Alkaline Phosphatase 135 (H) 38 - 126 U/L    Total Bilirubin 0.9 0.0 - 1.2 mg/dL   GFR, Estimated >39 >39 mL/min    Comment: (NOTE) Calculated using the CKD-EPI Creatinine Equation (2021)    Anion gap 11 5 - 15    Comment: Performed at [facility], [address] [place], [place] [address]  CBC     Status: Abnormal   Collection Time: 07/01/23  4:50 PM  Result Value Ref Range   WBC 9.8 4.0 - 10.5 K/uL   RBC 4.34 3.87 - 5.11 MIL/uL   Hemoglobin 11.9 (L) 12.0 - 15.0 g/dL   HCT 63.2 63.9 - 53.9 %   MCV 84.6 80.0 - 100.0 fL   MCH 27.4 26.0 - 34.0 pg   MCHC 32.4 30.0 - 36.0 g/dL   RDW 87.1 88.4 - 84.4 %   Platelets 263 150 - 400 K/uL   nRBC 0.0 0.0 - 0.2 %    Comment: Performed at [facility], [address] [place], [place] [address]  Pregnancy, urine     Status: None   Collection Time: 07/01/23  4:50 PM  Result Value Ref Range   Preg Test, Ur NEGATIVE NEGATIVE    Comment:        THE SENSITIVITY OF THIS METHODOLOGY IS >20 mIU/mL. Performed at [facility], [address] [place], [place] [address]   Troponin T, High Sensitivity     Status: None   Collection Time: 07/01/23  4:50 PM  Result Value Ref Range   Troponin T High Sensitivity <15 <19 ng/L    Comment: (NOTE) Biotin concentrations > 1000 ng/mL falsely decrease TnT results.  Serial cardiac troponin measurements are suggested.  Refer to the Links section for chest pain algorithms and additional  guidance. Performed at [facility], [address] [place], [place] [address]    CT ABDOMEN PELVIS W CONTRAST Result Date: 07/01/2023 CLINICAL DATA:  Acute abdominal pain EXAM: CT ABDOMEN AND PELVIS WITH CONTRAST TECHNIQUE: Multidetector CT imaging of the abdomen and pelvis was performed using the standard protocol following bolus administration of intravenous contrast. RADIATION DOSE REDUCTION: This exam was performed according to the departmental dose-optimization program which includes automated exposure control,  adjustment of the mA and/or kV according to patient size and/or use of iterative reconstruction technique. CONTRAST:  100mL OMNIPAQUE IOHEXOL 300 MG/ML  SOLN COMPARISON:  Ultrasound from earlier in the same day, CT from 06/06/2020 FINDINGS: Lower chest: No acute abnormality. Hepatobiliary: Liver is within normal limits. Gallbladder is  well distended with gallstones identified. Suggestion of wall thickness is noted although this was not borne out on recent ultrasound exam. Pancreas: Unremarkable. No pancreatic ductal dilatation or surrounding inflammatory changes. Spleen: Normal in size without focal abnormality. Adrenals/Urinary Tract: Adrenal glands are within normal limits. Kidneys demonstrate a normal enhancement pattern bilaterally. No renal calculi or obstructive changes are seen. The bladder is partially distended. Stomach/Bowel: The appendix is within normal limits. No obstructive or inflammatory changes of the colon are seen. Small bowel and stomach appear within normal limits. Vascular/Lymphatic: No significant vascular findings are present. No enlarged abdominal or pelvic lymph nodes. Reproductive: Uterus and bilateral adnexa are unremarkable. Other: No abdominal wall hernia or abnormality. No abdominopelvic ascites. Musculoskeletal: No acute or significant osseous findings. IMPRESSION: Cholelithiasis with suggestion of wall thickening although this is not borne out on recent ultrasound. No other focal abnormality is noted. Electronically Signed   By: Oneil Devonshire M.D.   On: 07/01/2023 20:29   US  Abdomen Limited RUQ (LIVER/GB) Result Date: 07/01/2023 CLINICAL DATA:  [id number] Pain [id number] EXAM: ULTRASOUND ABDOMEN LIMITED RIGHT UPPER QUADRANT COMPARISON:  Jun 06, 2020 FINDINGS: Gallbladder: Multiple gallstones. No wall thickening or pericholecystic fluid. No sonographic Murphy's sign noted by sonographer. Common bile duct: Diameter: 2 mm Liver: Normal echogenicity. No focal lesion identified. No intrahepatic  biliary ductal dilation. Portal vein is patent on color Doppler imaging with normal direction of blood flow towards the liver. Other: None. IMPRESSION: Cholecystolithiasis. No changes of acute cholecystitis. Electronically Signed   By: Rogelia Myers M.D.   On: 07/01/2023 19:13    Review of Systems  Gastrointestinal:  Positive for abdominal pain.  All other systems reviewed and are negative.   Blood pressure 117/76, pulse 73, temperature 98.3 F (36.8 C), temperature source Oral, resp. rate 17, weight 82.1 kg, last menstrual period 06/15/2023, SpO2 98%. Physical Exam Vitals reviewed.   Cardiovascular:     Rate and Rhythm: Normal rate.  Pulmonary:     Effort: Pulmonary effort is normal.  Abdominal:     Tenderness: There is abdominal tenderness in the right upper quadrant.     Hernia: No hernia is present.   Skin:    General: Skin is warm.   Neurological:     General: No focal deficit present.     Mental Status: She is alert.   Psychiatric:        Mood and Affect: Mood normal.      Assessment/Plan Symptomatic cholelithiasis with possible early cholecystitis  Patient continues to have pain and tenderness.  I recommend laparoscopic cholecystectomy during this admission.  She is not from here intramural procedure.  She clearly has a strong desire to go back there for surgery.  I have concerned she has acute cholecystitis therefore I do not think she can make it back home to have this done, and fashion.  She will think things over and let us  know what she would like to do.  White count is normal but she is still tender on examination.  Marcia DELENA Shipper, MD 07/02/2023, 11:57 AM High complexity

## 2023-07-02 NOTE — Progress Notes (Signed)
 Patient with ongoing RUQ tenderness to palpation. States her mother, sister, and friend have all needed cholecystectomy.   She expresses hesitation to surgery due to being out of town, which is understandable. Specifically she is concerned about paying a hospital/surgical bill with out of state insurance; also the logistics of travel back to [place] after surgery.   Will ask TOC if they can help answer insurance questions. Also advised patient to call her insurance company.  HH diet, NPO MN. CMP in AM. CBC in AM. Surgery tomorrow for cholecystitis vs discharge if patient does not agree to surgery here.   Almarie Pringle, PA-C [facility] Please see Amion for pager number during day hours 7:00am-4:30pm

## 2023-07-02 NOTE — ED Notes (Signed)
 Report given to [facility].

## 2023-07-02 NOTE — ED Notes (Signed)
Pt ambulated to the restroom without assistance

## 2023-07-02 NOTE — ED Notes (Signed)
 Report given to the Floor RN.Marland KitchenMarland Kitchen

## 2023-07-03 ENCOUNTER — Encounter (HOSPITAL_COMMUNITY): Payer: Self-pay | Admitting: Anesthesiology

## 2023-07-03 ENCOUNTER — Encounter (HOSPITAL_COMMUNITY)
Admission: EM | Discharge status: Against Medical Advice | Payer: Self-pay | Source: Home / Self Care | Attending: Emergency Medicine

## 2023-07-03 LAB — COMPREHENSIVE METABOLIC PANEL WITH GFR
ALT: 36 U/L (ref 0–44)
AST: 31 U/L (ref 15–41)
Albumin: 3.2 g/dL — ABNORMAL LOW (ref 3.5–5.0)
Alkaline Phosphatase: 105 U/L (ref 38–126)
Anion gap: 10 (ref 5–15)
BUN: 6 mg/dL (ref 6–20)
CO2: 25 mmol/L (ref 22–32)
Calcium: 8.9 mg/dL (ref 8.9–10.3)
Chloride: 99 mmol/L (ref 98–111)
Creatinine, Ser: 0.57 mg/dL (ref 0.44–1.00)
GFR, Estimated: >60 mL/min (ref >60)
Glucose, Bld: 83 mg/dL (ref 70–99)
Potassium: 3.9 mmol/L (ref 3.5–5.1)
Sodium: 134 mmol/L — ABNORMAL LOW (ref 135–145)
Total Bilirubin: 1.9 mg/dL — ABNORMAL HIGH (ref 0.0–1.2)
Total Protein: 6.9 g/dL (ref 6.5–8.1)

## 2023-07-03 LAB — CBC
HCT: 38.2 % (ref 36.0–46.0)
Hemoglobin: 12 g/dL (ref 12.0–15.0)
MCH: 27.8 pg (ref 26.0–34.0)
MCHC: 31.4 g/dL (ref 30.0–36.0)
MCV: 88.4 fL (ref 80.0–100.0)
Platelets: 204 10*3/uL (ref 150–400)
RBC: 4.32 MIL/uL (ref 3.87–5.11)
RDW: 12.9 % (ref 11.5–15.5)
WBC: 8.2 10*3/uL (ref 4.0–10.5)
nRBC: 0 % (ref 0.0–0.2)

## 2023-07-03 SURGERY — LAPAROSCOPIC CHOLECYSTECTOMY WITH INTRAOPERATIVE CHOLANGIOGRAM
Anesthesia: General

## 2023-07-03 MED ORDER — FENTANYL CITRATE (PF) 100 MCG/2ML IJ SOLN
INTRAMUSCULAR | Status: AC
  Filled 2023-07-03: qty 2

## 2023-07-03 MED ORDER — DEXAMETHASONE SODIUM PHOSPHATE 10 MG/ML IJ SOLN
INTRAMUSCULAR | Status: AC
  Filled 2023-07-03: qty 1

## 2023-07-03 MED ORDER — PROPOFOL 10 MG/ML IV BOLUS
INTRAVENOUS | Status: AC
  Filled 2023-07-03: qty 20

## 2023-07-03 MED ORDER — MIDAZOLAM HCL 2 MG/2ML IJ SOLN
INTRAMUSCULAR | Status: AC
  Filled 2023-07-03: qty 2

## 2023-07-03 MED ORDER — ROCURONIUM BROMIDE 10 MG/ML (PF) SYRINGE
PREFILLED_SYRINGE | INTRAVENOUS | Status: AC
  Filled 2023-07-03: qty 10

## 2023-07-03 MED ORDER — LIDOCAINE HCL (PF) 2 % IJ SOLN
INTRAMUSCULAR | Status: AC
  Filled 2023-07-03: qty 5

## 2023-07-03 MED ORDER — ONDANSETRON HCL 4 MG/2ML IJ SOLN
INTRAMUSCULAR | Status: AC
  Filled 2023-07-03: qty 2

## 2023-07-03 MED ORDER — INDOCYANINE GREEN 25 MG IV SOLR: 2.5000 mg | INTRAVENOUS | Status: DC

## 2023-07-03 NOTE — Progress Notes (Signed)
 Pt spoke at length with Almarie Pringle, CCS PA and elected to leave AMA and seek care in [place] . AMA form signed and in chart.

## 2023-07-03 NOTE — Plan of Care (Signed)
  Problem: Clinical Measurements: Goal: Will remain free from infection Outcome: Progressing   Problem: Clinical Measurements: Goal: Diagnostic test results will improve Outcome: Progressing   Problem: Activity: Goal: Risk for activity intolerance will decrease Outcome: Completed/Met
